# Patient Record
Sex: Female | Born: 2000 | Race: Asian | Hispanic: No | Marital: Single | Smoking: Never smoker
Health system: Southern US, Community
[De-identification: ages and names within clinical notes are randomized; demographics above are authoritative.]

---
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
## 2020-03-05 ENCOUNTER — Other Ambulatory Visit (HOSPITAL_COMMUNITY)
Admission: RE | Admit: 2020-03-05 | Discharge: 2020-03-05 | Disposition: A | Discharge status: Home / Self Care | Payer: 59 | Source: Ambulatory Visit | Attending: Family Medicine | Admitting: Family Medicine

## 2020-03-05 ENCOUNTER — Encounter: Payer: Self-pay | Admitting: Family Medicine

## 2020-03-05 ENCOUNTER — Other Ambulatory Visit: Payer: Self-pay

## 2020-03-05 ENCOUNTER — Ambulatory Visit (INDEPENDENT_AMBULATORY_CARE_PROVIDER_SITE_OTHER): Payer: 59 | Admitting: Family Medicine

## 2020-03-05 VITALS — BP 121/72 | HR 66 | Temp 97.9°F | Resp 16 | Ht 63.0 in | Wt 129.1 lb

## 2020-03-05 DIAGNOSIS — B9689 Other specified bacterial agents as the cause of diseases classified elsewhere: Secondary | ICD-10-CM | POA: Insufficient documentation

## 2020-03-05 DIAGNOSIS — N898 Other specified noninflammatory disorders of vagina: Secondary | ICD-10-CM

## 2020-03-05 DIAGNOSIS — N760 Acute vaginitis: Secondary | ICD-10-CM | POA: Insufficient documentation

## 2020-03-05 NOTE — Patient Instructions (Signed)
Follow up as needed or as scheduled We'll notify you of your urine results and make any changes if needed START a daily probiotic USE the Boric Acid suppositories 2-3x/week to prevent recurrent BV (DO NOT SWALLOW BORIC ACID- it's toxic) If symptoms continue, please message me so we can make another plan Call with any questions or concerns Hang in there!

## 2020-03-05 NOTE — Progress Notes (Signed)
   Subjective:    Patient ID: Shannon Doyle, female    DOB: [birth year], 19 y.o.   MRN: [id number]  HPI Recurrent BV- pt is having recurrent infections after her period.  Is not currently on OCPs but picked up new pack yesterday.  Has been using OTC Boric Acid suppositories- initially used for 2 weeks and then will insert suppository if sxs return.  Not taking probiotic.  Not currently having BV sxs.  Pt reports after menses will have thick d/c w/ yellow green tint.  Denies pelvic pain.  No concerns for STDs.     Review of Systems For ROS see HPI   This visit occurred during the SARS-CoV-2 public health emergency.  Safety protocols were in place, including screening questions prior to the visit, additional usage of staff PPE, and extensive cleaning of exam room while observing appropriate contact time as indicated for disinfecting solutions.       Objective:   Physical Exam Vitals reviewed.  Constitutional:      General: She is not in acute distress.    Appearance: Normal appearance. She is normal weight. She is not ill-appearing.  HENT:     Head: Normocephalic and atraumatic.  Genitourinary:    Comments: Deferred due to menses Neurological:     General: No focal deficit present.     Mental Status: She is alert and oriented to person, place, and time.  Psychiatric:        Mood and Affect: Mood normal.        Behavior: Behavior normal.        Thought Content: Thought content normal.           Assessment & Plan:  Recurrent BV/vaginal d/c- pt reports she will repeatedly get BV after each period.  She is concerned that others can smell this.  She used boric acid suppositories for a week which improved her sxs but they returned when she stopped using the suppositories.  Will start 3x/week boric acid and a daily probiotic.  R/o GC/CT/trichomonas.  Pt expressed understanding and is in agreement w/ plan.

## 2020-03-06 LAB — URINE CYTOLOGY ANCILLARY ONLY
Chlamydia: NEGATIVE
Comment: NEGATIVE
Comment: NEGATIVE
Comment: NORMAL
Neisseria Gonorrhea: NEGATIVE
Trichomonas: NEGATIVE

## 2020-03-06 NOTE — Progress Notes (Signed)
Called pt and lmovm to inform results are negative and available on mychart.

## 2020-09-04 ENCOUNTER — Encounter: Payer: Self-pay | Admitting: Family Medicine

## 2020-12-30 ENCOUNTER — Encounter: Payer: Self-pay | Admitting: *Deleted

## 2021-10-18 ENCOUNTER — Ambulatory Visit (INDEPENDENT_AMBULATORY_CARE_PROVIDER_SITE_OTHER): Payer: 59 | Admitting: Family Medicine

## 2021-10-18 ENCOUNTER — Encounter: Payer: Self-pay | Admitting: Family Medicine

## 2021-10-18 VITALS — BP 112/70 | HR 88 | Temp 98.2°F | Resp 16 | Wt 144.2 lb

## 2021-10-18 DIAGNOSIS — R238 Other skin changes: Secondary | ICD-10-CM | POA: Diagnosis not present

## 2021-10-18 DIAGNOSIS — L309 Dermatitis, unspecified: Secondary | ICD-10-CM | POA: Insufficient documentation

## 2021-10-18 DIAGNOSIS — R079 Chest pain, unspecified: Secondary | ICD-10-CM | POA: Diagnosis not present

## 2021-10-18 DIAGNOSIS — L700 Acne vulgaris: Secondary | ICD-10-CM | POA: Insufficient documentation

## 2021-10-18 MED ORDER — TRIAMCINOLONE ACETONIDE 0.1 % EX OINT: 1.0000 "application " | TOPICAL_OINTMENT | Freq: Two times a day (BID) | CUTANEOUS | 1 refills | Status: AC

## 2021-10-18 MED ORDER — CETIRIZINE HCL 10 MG PO TABS: 10.0000 mg | ORAL_TABLET | Freq: Every day | ORAL | 11 refills | Status: DC

## 2021-10-18 NOTE — Progress Notes (Signed)
? ?  Subjective:  ? ? Patient ID: Shannon Doyle, female    DOB: Oct 17, 2000, 20 y.o.   MRN: [id number] ? ?HPI ?Chest pain- 'it's super random'.  Sxs started ~1 month ago.  Has occurred twice in the last month.  Pain will occur under sternum.  The first occurrence was during the night.  Improved w/ water but did not resolve until morning.  2nd episode was while she was bending over and lasted for ~30 minutes.  Not related to eating that pt can tell.  Has sensation of fullness in chest at night.  Denies sour brash.  Denies nausea/vomiting.  No SOB, diaphoresis.   ? ?Skin sensitivity- pt reports dermatographia with the slightest scratch.  No hives.  Will develop red bumps when applying lotion.  No known allergies.  Can occur after being outside.  Very itchy.  Hx of hand dermatitis. ? ? ?Review of Systems ?For ROS see HPI  ?   ?Objective:  ? Physical Exam ?Vitals reviewed.  ?Constitutional:   ?   General: She is not in acute distress. ?   Appearance: She is well-developed.  ?HENT:  ?   Head: Normocephalic and atraumatic.  ?Eyes:  ?   Conjunctiva/sclera: Conjunctivae normal.  ?   Pupils: Pupils are equal, round, and reactive to light.  ?Neck:  ?   Thyroid: No thyromegaly.  ?Cardiovascular:  ?   Rate and Rhythm: Normal rate and regular rhythm.  ?   Heart sounds: Normal heart sounds. No murmur heard. ?Pulmonary:  ?   Effort: Pulmonary effort is normal. No respiratory distress.  ?   Breath sounds: Normal breath sounds.  ?Abdominal:  ?   General: There is no distension.  ?   Palpations: Abdomen is soft.  ?   Tenderness: There is no abdominal tenderness.  ?Musculoskeletal:  ?   Cervical back: Normal range of motion and neck supple.  ?Lymphadenopathy:  ?   Cervical: No cervical adenopathy.  ?Skin: ?   General: Skin is warm and dry.  ?   Findings: No erythema or rash.  ?Neurological:  ?   Mental Status: She is alert and oriented to person, place, and time.  ?Psychiatric:     ?   Behavior: Behavior normal.  ? ? ? ? ? ?   ?Assessment  & Plan:  ?Chest pain- new.  Pt's sxs are more consistent w/ GERD than cardiac chest pain.  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  Each of these would be consistent w/ reflux or esophageal spasm and not ischemia.  Encouraged her to log her sxs and if the recur to try Tums.  If no relief w/ Tums or sxs become more consistent, will start daily acid suppression.  Pt expressed understanding and is in agreement w/ plan.  ? ?Skin sensitivity- new.  Pt had pictures of previous reaction and it was consistent w/ contact dermatitis of hand/wrist.  Suspect she has atopic dermatitis.  Will start daily antihistamine to prevent itching and breakouts and will use Triamcinolone as needed for red, itchy areas as they appear.  If sxs continue or worsen will get allergy testing.  Pt expressed understanding and is in agreement w/ plan.  ? ?

## 2021-10-18 NOTE — Patient Instructions (Signed)
Follow up as needed or as scheduled ?The chest pain sounds more like reflux/indigestion than true chest pain ?Try and keep track of when it occurs, how long it lasts, if you've eaten or not, what makes it better or worse ?The next time you have chest pain, take 2-3 Tums and see if things improve ?If the symptoms start to become more frequent, let me know and we can start a daily controller medication ?START the Cetirizine daily to help w/ the itching and skin sensitivity ?USE the Triamcinolone ointment on areas that break out ?Call with any questions or concerns ?Hang in there! ?Happy Early Birthday!! ?

## 2024-07-01 ENCOUNTER — Ambulatory Visit (INDEPENDENT_AMBULATORY_CARE_PROVIDER_SITE_OTHER): Admitting: Family Medicine

## 2024-07-01 ENCOUNTER — Encounter: Payer: Self-pay | Admitting: Family Medicine

## 2024-07-01 VITALS — BP 112/84 | HR 104 | Temp 98.0°F | Ht 61.0 in | Wt 159.0 lb

## 2024-07-01 DIAGNOSIS — Z0000 Encounter for general adult medical examination without abnormal findings: Secondary | ICD-10-CM | POA: Diagnosis not present

## 2024-07-01 DIAGNOSIS — Z124 Encounter for screening for malignant neoplasm of cervix: Secondary | ICD-10-CM

## 2024-07-01 DIAGNOSIS — E669 Obesity, unspecified: Secondary | ICD-10-CM | POA: Diagnosis not present

## 2024-07-01 MED ORDER — ONDANSETRON HCL 4 MG PO TABS: 4.0000 mg | ORAL_TABLET | Freq: Three times a day (TID) | ORAL | 0 refills | Status: AC | PRN

## 2024-07-01 MED ORDER — CIPROFLOXACIN HCL 500 MG PO TABS: 500.0000 mg | ORAL_TABLET | Freq: Two times a day (BID) | ORAL | 0 refills | Status: AC

## 2024-07-01 NOTE — Progress Notes (Signed)
" ° °  Subjective:    Patient ID: Shannon Doyle, female    DOB: 10-01-2000, 23 y.o.   MRN: [id number]  HPI CPE- due for pap, Tdap, flu, HPV.  Has never had pap, is currently sexually active.  Not on birth control and not using condoms.  Does not want to be pregnant.     Review of Systems Patient reports no vision/ hearing changes, adenopathy,fever, weight change,  persistant/recurrent hoarseness , swallowing issues, chest pain, palpitations, edema, persistant/recurrent cough, hemoptysis, dyspnea (rest/exertional/paroxysmal nocturnal), gastrointestinal bleeding (melena, rectal bleeding), abdominal pain, significant heartburn, bowel changes, GU symptoms (dysuria, hematuria, incontinence), Gyn symptoms (abnormal  bleeding, pain),  syncope, focal weakness, memory loss, numbness & tingling, skin/hair/nail changes, abnormal bruising or bleeding, anxiety, or depression.     Objective:   Physical Exam General Appearance:    Alert, cooperative, no distress, appears stated age  Head:    Normocephalic, without obvious abnormality, atraumatic  Eyes:    PERRL, conjunctiva/corneas clear, EOM's intact both eyes  Ears:    Normal TM's and external ear canals, both ears  Nose:   Nares normal, septum midline, mucosa normal, no drainage    or sinus tenderness  Throat:   Lips, mucosa, and tongue normal; teeth and gums normal  Neck:   Supple, symmetrical, trachea midline, no adenopathy;    Thyroid: no enlargement/tenderness/nodules  Back:     Symmetric, no curvature, ROM normal, no CVA tenderness  Lungs:     Clear to auscultation bilaterally, respirations unlabored  Chest Wall:    No tenderness or deformity   Heart:    Regular rate and rhythm, S1 and S2 normal, no murmur, rub   or gallop  Breast Exam:    Deferred to GYN  Abdomen:     Soft, non-tender, bowel sounds active all four quadrants,    no masses, no organomegaly  Genitalia:    Deferred to GYN  Rectal:    Extremities:   Extremities normal, atraumatic, no  cyanosis or edema  Pulses:   2+ and symmetric all extremities  Skin:   Skin color, texture, turgor normal, no rashes or lesions  Lymph nodes:   Cervical, supraclavicular, and axillary nodes normal  Neurologic:   CNII-XII intact, normal strength, sensation and reflexes    throughout          Assessment & Plan:    "

## 2024-07-01 NOTE — Patient Instructions (Signed)
 Follow up in 1 year or as needed We'll notify you of your lab results and make any changes if needed We'll call you to schedule your GYN appt Continue to work on healthy diet and regular exercise- you can do it! Take the Cipro  on your trip in case of Traveller's Diarrhea and use the Ondansetron in case of nausea or vomiting Call with any questions or concerns Stay Safe!  Stay Healthy! Happy New Year!

## 2024-07-01 NOTE — Assessment & Plan Note (Signed)
 Pt's PE WNL w/ exception of BMI.  Due for pap smear- referral placed to GYN.  Tdap and flu shots given today.  Will discuss HPV vaccines w/ GYN.  Check labs.  Anticipatory guidance provided.

## 2024-07-02 LAB — BASIC METABOLIC PANEL WITH GFR
BUN: 9 mg/dL (ref 6–23)
CO2: 31 meq/L (ref 19–32)
Calcium: 10.1 mg/dL (ref 8.4–10.5)
Chloride: 101 meq/L (ref 96–112)
Creatinine, Ser: 0.57 mg/dL (ref 0.40–1.20)
GFR: 127.85 mL/min
Glucose, Bld: 70 mg/dL (ref 70–99)
Potassium: 4.4 meq/L (ref 3.5–5.1)
Sodium: 140 meq/L (ref 135–145)

## 2024-07-02 LAB — CBC WITH DIFFERENTIAL/PLATELET
Basophils Absolute: 0.1 K/uL (ref 0.0–0.1)
Basophils Relative: 0.7 % (ref 0.0–3.0)
Eosinophils Absolute: 0.4 K/uL (ref 0.0–0.7)
Eosinophils Relative: 4.2 % (ref 0.0–5.0)
HCT: 43.8 % (ref 36.0–46.0)
Hemoglobin: 14.7 g/dL (ref 12.0–15.0)
Lymphocytes Relative: 19.4 % (ref 12.0–46.0)
Lymphs Abs: 1.9 K/uL (ref 0.7–4.0)
MCHC: 33.6 g/dL (ref 30.0–36.0)
MCV: 84.4 fl (ref 78.0–100.0)
Monocytes Absolute: 0.7 K/uL (ref 0.1–1.0)
Monocytes Relative: 7.5 % (ref 3.0–12.0)
Neutro Abs: 6.6 K/uL (ref 1.4–7.7)
Neutrophils Relative %: 68.2 % (ref 43.0–77.0)
Platelets: 342 K/uL (ref 150.0–400.0)
RBC: 5.2 Mil/uL — ABNORMAL HIGH (ref 3.87–5.11)
RDW: 12.9 % (ref 11.5–15.5)
WBC: 9.6 K/uL (ref 4.0–10.5)

## 2024-07-02 LAB — VITAMIN D 25 HYDROXY (VIT D DEFICIENCY, FRACTURES): VITD: 8.74 ng/mL — ABNORMAL LOW (ref 30.00–100.00)

## 2024-07-02 LAB — LIPID PANEL
Cholesterol: 143 mg/dL (ref 28–200)
HDL: 50.5 mg/dL
LDL Cholesterol: 55 mg/dL (ref 10–99)
NonHDL: 92.4
Total CHOL/HDL Ratio: 3
Triglycerides: 188 mg/dL — ABNORMAL HIGH (ref 10.0–149.0)
VLDL: 37.6 mg/dL (ref 0.0–40.0)

## 2024-07-02 LAB — HEPATIC FUNCTION PANEL
ALT: 11 U/L (ref 3–35)
AST: 14 U/L (ref 5–37)
Albumin: 4.9 g/dL (ref 3.5–5.2)
Alkaline Phosphatase: 77 U/L (ref 39–117)
Bilirubin, Direct: 0.1 mg/dL (ref 0.1–0.3)
Total Bilirubin: 0.4 mg/dL (ref 0.2–1.2)
Total Protein: 8 g/dL (ref 6.0–8.3)

## 2024-07-02 LAB — HEMOGLOBIN A1C: Hgb A1c MFr Bld: 5.7 % (ref 4.6–6.5)

## 2024-07-02 LAB — TSH: TSH: 1.01 u[IU]/mL (ref 0.35–5.50)

## 2024-07-03 ENCOUNTER — Ambulatory Visit: Payer: Self-pay | Admitting: Family Medicine

## 2024-07-03 MED ORDER — VITAMIN D (ERGOCALCIFEROL) 1.25 MG (50000 UNIT) PO CAPS: 50000.0000 [IU] | ORAL_CAPSULE | ORAL | 0 refills | Status: AC

## 2025-07-02 ENCOUNTER — Encounter: Admitting: Family Medicine
